# Patient Record
Sex: Female | Born: 1995 | Race: Black or African American | Hispanic: No | Marital: Single | Smoking: Never smoker
Health system: Southern US, Community
[De-identification: ages and names within clinical notes are randomized; demographics above are authoritative.]

---

## 2019-09-10 ENCOUNTER — Ambulatory Visit (HOSPITAL_COMMUNITY)
Admission: EM | Admit: 2019-09-10 | Discharge: 2019-09-10 | Disposition: A | Discharge status: Home / Self Care | Payer: Self-pay | Attending: Emergency Medicine | Admitting: Emergency Medicine

## 2019-09-10 ENCOUNTER — Encounter (HOSPITAL_COMMUNITY): Payer: Self-pay

## 2019-09-10 ENCOUNTER — Other Ambulatory Visit: Payer: Self-pay

## 2019-09-10 DIAGNOSIS — M7918 Myalgia, other site: Secondary | ICD-10-CM

## 2019-09-10 DIAGNOSIS — W57XXXA Bitten or stung by nonvenomous insect and other nonvenomous arthropods, initial encounter: Secondary | ICD-10-CM

## 2019-09-10 DIAGNOSIS — S90862A Insect bite (nonvenomous), left foot, initial encounter: Secondary | ICD-10-CM

## 2019-09-10 MED ORDER — IBUPROFEN 800 MG PO TABS: 800.0000 mg | ORAL_TABLET | Freq: Three times a day (TID) | ORAL | 0 refills | Status: DC | PRN

## 2019-09-10 MED ORDER — CYCLOBENZAPRINE HCL 10 MG PO TABS: 10.0000 mg | ORAL_TABLET | Freq: Two times a day (BID) | ORAL | 0 refills | Status: DC | PRN
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
## 2019-09-10 NOTE — Discharge Instructions (Signed)
Take the prescribed ibuprofen as needed for your pain.  Take the muscle relaxer Flexeril as needed for muscle spasm; do not drive, operate machinery, or drink alcohol with this medication as it may make you drowsy.    Return here or follow up with your primary care provider if your pain is not improving or gets worse; or if you develop new symptoms such as weakness, numbness, tingling.

## 2019-09-18 ENCOUNTER — Encounter (HOSPITAL_COMMUNITY): Payer: Self-pay | Admitting: Emergency Medicine

## 2019-09-18 ENCOUNTER — Emergency Department (HOSPITAL_COMMUNITY)
Admission: EM | Admit: 2019-09-18 | Discharge: 2019-09-18 | Disposition: A | Discharge status: Home / Self Care | Payer: Medicaid Other | Attending: Emergency Medicine | Admitting: Emergency Medicine

## 2019-09-18 ENCOUNTER — Emergency Department (HOSPITAL_COMMUNITY): Payer: Medicaid Other

## 2019-09-18 DIAGNOSIS — R0600 Dyspnea, unspecified: Secondary | ICD-10-CM | POA: Insufficient documentation

## 2019-09-18 LAB — CBC
HCT: 42.5 % (ref 36.0–46.0)
Hemoglobin: 13.7 g/dL (ref 12.0–15.0)
MCH: 29.8 pg (ref 26.0–34.0)
MCHC: 32.2 g/dL (ref 30.0–36.0)
MCV: 92.4 fL (ref 80.0–100.0)
Platelets: 254 10*3/uL (ref 150–400)
RBC: 4.6 MIL/uL (ref 3.87–5.11)
RDW: 12.1 % (ref 11.5–15.5)
WBC: 5.2 10*3/uL (ref 4.0–10.5)
nRBC: 0 % (ref 0.0–0.2)

## 2019-09-18 LAB — BASIC METABOLIC PANEL
Anion gap: 7 (ref 5–15)
BUN: 8 mg/dL (ref 6–20)
CO2: 24 mmol/L (ref 22–32)
Calcium: 9.2 mg/dL (ref 8.9–10.3)
Chloride: 106 mmol/L (ref 98–111)
Creatinine, Ser: 0.59 mg/dL (ref 0.44–1.00)
GFR calc Af Amer: >60 mL/min (ref >60)
GFR calc non Af Amer: >60 mL/min (ref >60)
Glucose, Bld: 98 mg/dL (ref 70–99)
Potassium: 3.5 mmol/L (ref 3.5–5.1)
Sodium: 137 mmol/L (ref 135–145)

## 2019-09-18 LAB — I-STAT BETA HCG BLOOD, ED (MC, WL, AP ONLY): I-stat hCG, quantitative: <5 m[IU]/mL (ref <5)

## 2019-09-18 NOTE — ED Provider Notes (Signed)
[facility] DEPT Provider Note   CSN: [id number] Arrival date & time: 09/18/19  0751     History   Chief Complaint Chief Complaint  Patient presents with  . Shortness of Breath    HPI Christina Singh is a 23 y.o. female.     HPI Patient presented to the emergency room for evaluation of shortness of breath.  Patient states she woke up this morning she felt like she could not catch her breath.  She felt tightness in her chest.  Her roommate was concerned , pt came to the emergency room for evaluation.  Patient denies any trouble with fevers or chills.  She has not had a cough.  She has had some nasal congestion recently and does still feel like her upper airway is dry.  She denies any known COVID-19 exposures.    Patient symptoms have improved and she is feeling better than she did originally.  She did not take any medications or was given any specific treatment.  She denies any prior history of asthma.  No history of DVT or PE.  She does not take any estrogen medications. History reviewed. No pertinent past medical history.  There are no active problems to display for this patient.   History reviewed. No pertinent surgical history.   OB History   No obstetric history on file.      Home Medications    Prior to Admission medications   Medication Sig Start Date End Date Taking? Authorizing Provider  Phenylephrine-Pheniramine-DM [facility] COLD & COUGH PO) Take 1 tablet by mouth every 4 (four) hours as needed (congestion).   Yes [provider]    Family History No family history on file.  Social History Social History   Tobacco Use  . Smoking status: Never Smoker  . Smokeless tobacco: Never Used  Substance Use Topics  . Alcohol use: Yes  . Drug use: Never     Allergies   Patient has no known allergies.   Review of Systems Review of Systems  All other systems reviewed and are negative.    Physical Exam Updated  Vital Signs BP (!) 153/97 (BP Location: Right Arm)   Pulse 87   Temp 98.3 F (36.8 C) (Oral)   Resp 18   LMP 08/21/2019   SpO2 100%   Physical Exam Vitals signs and nursing note reviewed.  Constitutional:      General: She is not in acute distress.    Appearance: She is well-developed.  HENT:     Head: Normocephalic and atraumatic.     Right Ear: External ear normal. No drainage or swelling. No middle ear effusion.     Left Ear: External ear normal. No drainage or swelling. There is impacted cerumen.     Mouth/Throat:     Mouth: Mucous membranes are moist. No oral lesions.     Pharynx: Oropharynx is clear. No pharyngeal swelling, oropharyngeal exudate, posterior oropharyngeal erythema or uvula swelling.     Tonsils: No tonsillar exudate or tonsillar abscesses.  Eyes:     General: No scleral icterus.       Right eye: No discharge.        Left eye: No discharge.     Conjunctiva/sclera: Conjunctivae normal.  Neck:     Musculoskeletal: Neck supple.     Trachea: No tracheal deviation.  Cardiovascular:     Rate and Rhythm: Normal rate and regular rhythm.  Pulmonary:     Effort: Pulmonary effort is normal. No  respiratory distress.     Breath sounds: Normal breath sounds. No stridor. No wheezing or rales.  Abdominal:     General: Bowel sounds are normal. There is no distension.     Palpations: Abdomen is soft.     Tenderness: There is no abdominal tenderness. There is no guarding or rebound.  Musculoskeletal:        General: No tenderness.  Skin:    General: Skin is warm and dry.     Findings: No rash.  Neurological:     Mental Status: She is alert.     Cranial Nerves: No cranial nerve deficit (no facial droop, extraocular movements intact, no slurred speech).     Sensory: No sensory deficit.     Motor: No abnormal muscle tone or seizure activity.     Coordination: Coordination normal.      ED Treatments / Results  Labs (all labs ordered are listed, but only abnormal  results are displayed) Labs Reviewed  CBC  BASIC METABOLIC PANEL  I-STAT BETA HCG BLOOD, ED ([facility], [facility], [facility] ONLY)    EKG EKG Interpretation  Date/Time:  Sunday September 18 2019 09:38:09 EDT Ventricular Rate:  75 PR Interval:    QRS Duration: 74 QT Interval:  356 QTC Calculation: 398 R Axis:   50 Text Interpretation:  Sinus rhythm Atrial premature complex Baseline wander in lead(s) V5 No old tracing to compare Confirmed by Linwood Dibbles [phone]) on 09/18/2019 9:51:29 AM   Radiology Dg Chest 2 View  Result Date: 09/18/2019 CLINICAL DATA:  Dyspnea EXAM: CHEST - 2 VIEW COMPARISON:  None. FINDINGS: Lungs are clear.  No pleural effusion or pneumothorax. The heart is normal in size. Visualized osseous structures are within normal limits. IMPRESSION: Normal chest radiographs. Electronically Signed   By: Charline Bills M.D.   On: 09/18/2019 09:29    Procedures Procedures (including critical care time)  Medications Ordered in ED Medications - No data to display   Initial Impression / Assessment and Plan / ED Course  I have reviewed the triage vital signs and the nursing notes.  Pertinent labs & imaging results that were available during my care of the patient were reviewed by me and considered in my medical decision making (see chart for details).   Patient presented with complaints of dyspnea.  ED evaluation is reassuring.  Patient's not hypoxic.  She is breathing easily.  Doubt, pneumonia, pneumothorax, pulmonary embolism.  No signs of otitis media.  No signs of pharyngitis on exam.  Symptoms may be related to a viral URI.  Is also possible the patient may have had some anxiety.  Final Clinical Impressions(s) / ED Diagnoses   Final diagnoses:  Dyspnea, unspecified type    ED Discharge Orders    None       Linwood Dibbles, MD 09/18/19 1059

## 2019-09-18 NOTE — ED Triage Notes (Signed)
Per pt, states she woke up this morning and felt like she couldn't get any oxygen-patient is on her cell phone-no s/s's of respiratory distress-no history of asthma-appears to be anxious

## 2019-09-18 NOTE — Discharge Instructions (Addendum)
Follow-up with your primary care doctor to be rechecked in the next week if your symptoms have not resolved.  You can take over-the-counter medications as needed for nasal congestion.  Try applying some earwax drops in your left ear.

## 2019-09-18 NOTE — ED Notes (Signed)
Patient transported to X-ray 

## 2019-10-10 ENCOUNTER — Ambulatory Visit (HOSPITAL_COMMUNITY)
Admission: EM | Admit: 2019-10-10 | Discharge: 2019-10-10 | Disposition: A | Discharge status: Home / Self Care | Payer: Medicaid Other

## 2019-10-10 ENCOUNTER — Other Ambulatory Visit: Payer: Self-pay
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
## 2020-08-30 ENCOUNTER — Ambulatory Visit (HOSPITAL_COMMUNITY)
Admission: EM | Admit: 2020-08-30 | Discharge: 2020-08-30 | Disposition: A | Discharge status: Home / Self Care | Payer: Self-pay

## 2020-08-30 ENCOUNTER — Other Ambulatory Visit: Payer: Self-pay

## 2021-05-03 IMAGING — CR DG CHEST 2V
2 series · 2 of 2 positions shown · non-contrast
Comparison: None.

CLINICAL DATA: Dyspnea

EXAM:
CHEST - 2 VIEW

[w chest pa]
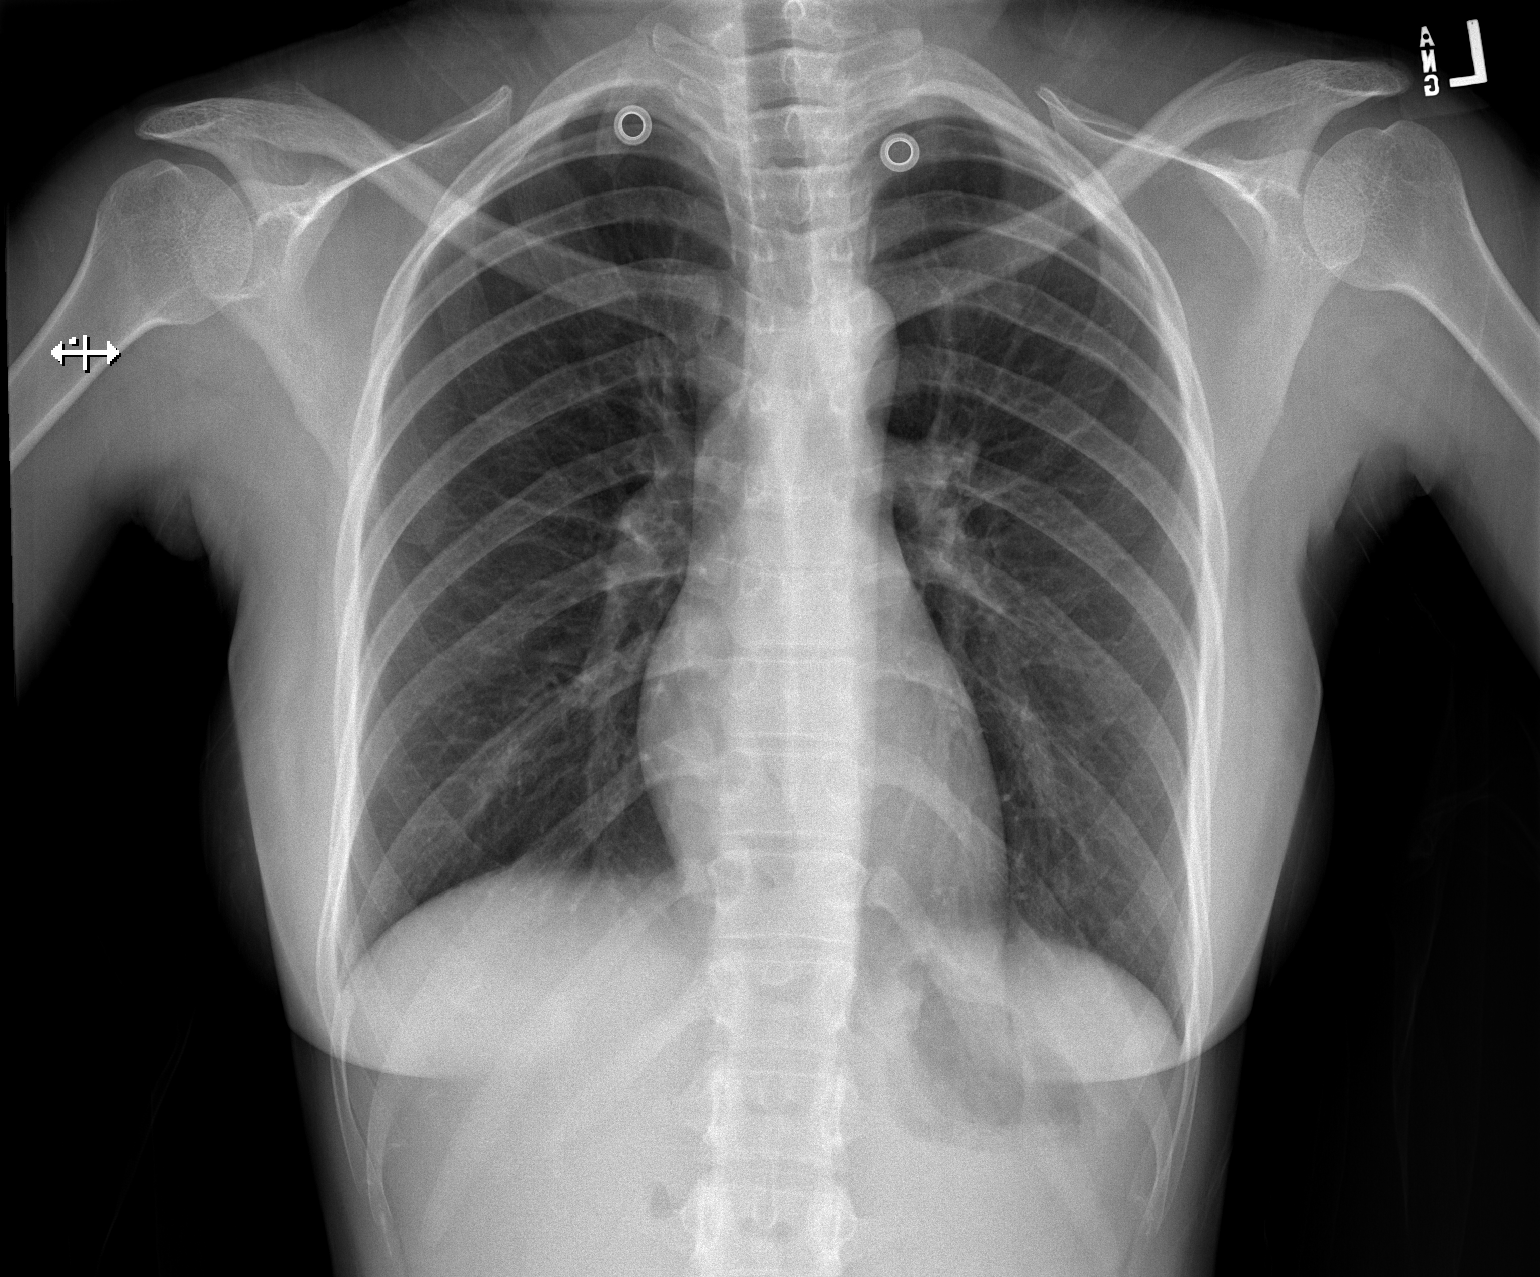

[w chest lat]
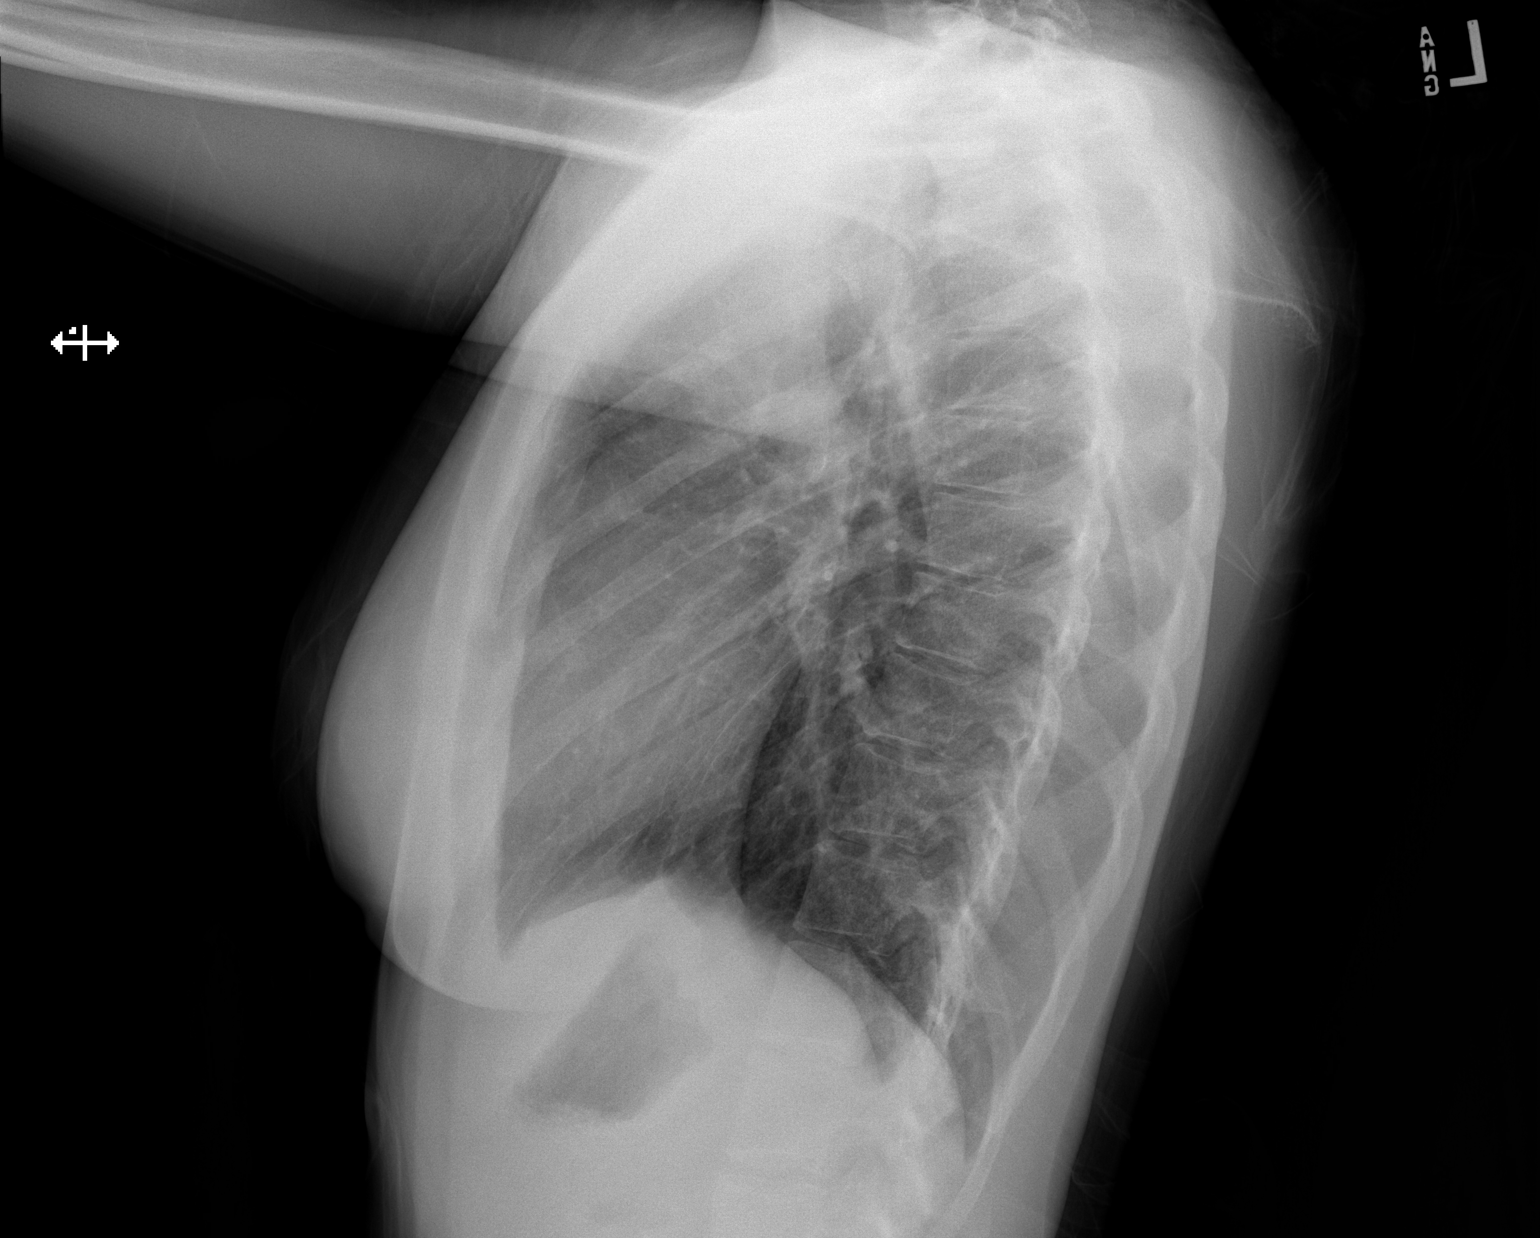

[2 of 2 positions shown; findings below may reference images not displayed]

FINDINGS: Lungs are clear.  No pleural effusion or pneumothorax.

The heart is normal in size.

Visualized osseous structures are within normal limits.
IMPRESSION: Normal chest radiographs.

## 2024-01-29 ENCOUNTER — Ambulatory Visit: Payer: Self-pay | Admitting: Family Medicine
